# Patient Record
Sex: Female | Born: 1985 | Race: White | Hispanic: No | Marital: Single | Smoking: Never smoker
Health system: Southern US, Community
[De-identification: ages and names within clinical notes are randomized; demographics above are authoritative.]

## PROBLEM LIST (undated history)

## (undated) DIAGNOSIS — E042 Nontoxic multinodular goiter: Secondary | ICD-10-CM

## (undated) DIAGNOSIS — F329 Major depressive disorder, single episode, unspecified: Secondary | ICD-10-CM

## (undated) DIAGNOSIS — G47419 Narcolepsy without cataplexy: Secondary | ICD-10-CM

## (undated) DIAGNOSIS — F32A Depression, unspecified: Secondary | ICD-10-CM

## (undated) HISTORY — DX: Nontoxic multinodular goiter: E04.2

## (undated) HISTORY — PX: CHOLECYSTECTOMY: SHX55

## (undated) HISTORY — PX: EYE SURGERY: SHX253

## (undated) HISTORY — PX: OTHER SURGICAL HISTORY: SHX169

## (undated) HISTORY — DX: Depression, unspecified: F32.A

## (undated) HISTORY — DX: Major depressive disorder, single episode, unspecified: F32.9

---
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
## 2004-08-31 ENCOUNTER — Ambulatory Visit: Payer: Self-pay | Admitting: Cardiology

## 2005-02-14 ENCOUNTER — Emergency Department (HOSPITAL_COMMUNITY): Admission: EM | Admit: 2005-02-14 | Discharge: 2005-02-14 | Payer: Self-pay | Admitting: Emergency Medicine

## 2005-03-02 ENCOUNTER — Ambulatory Visit: Payer: Self-pay | Admitting: Internal Medicine

## 2008-07-09 ENCOUNTER — Encounter: Payer: Self-pay | Admitting: Pulmonary Disease

## 2008-07-09 ENCOUNTER — Ambulatory Visit: Payer: Self-pay | Admitting: Cardiology

## 2008-07-20 ENCOUNTER — Ambulatory Visit: Payer: Self-pay | Admitting: Cardiology

## 2008-07-29 ENCOUNTER — Encounter: Payer: Self-pay | Admitting: Pulmonary Disease

## 2008-08-26 ENCOUNTER — Ambulatory Visit: Payer: Self-pay | Admitting: Pulmonary Disease

## 2008-08-26 DIAGNOSIS — I10 Essential (primary) hypertension: Secondary | ICD-10-CM | POA: Insufficient documentation

## 2008-08-26 DIAGNOSIS — R0602 Shortness of breath: Secondary | ICD-10-CM | POA: Insufficient documentation

## 2008-08-26 DIAGNOSIS — B009 Herpesviral infection, unspecified: Secondary | ICD-10-CM | POA: Insufficient documentation

## 2008-08-26 DIAGNOSIS — Z8679 Personal history of other diseases of the circulatory system: Secondary | ICD-10-CM | POA: Insufficient documentation

## 2008-08-26 DIAGNOSIS — O99810 Abnormal glucose complicating pregnancy: Secondary | ICD-10-CM

## 2008-08-26 DIAGNOSIS — J309 Allergic rhinitis, unspecified: Secondary | ICD-10-CM | POA: Insufficient documentation

## 2008-09-21 ENCOUNTER — Ambulatory Visit: Payer: Self-pay | Admitting: Pulmonary Disease

## 2009-02-06 ENCOUNTER — Emergency Department (HOSPITAL_COMMUNITY): Admission: EM | Admit: 2009-02-06 | Discharge: 2009-02-06 | Payer: Self-pay | Admitting: Emergency Medicine

## 2009-02-07 ENCOUNTER — Observation Stay (HOSPITAL_COMMUNITY): Admission: RE | Admit: 2009-02-07 | Discharge: 2009-02-08 | Payer: Self-pay | Admitting: Emergency Medicine

## 2009-02-08 ENCOUNTER — Encounter (INDEPENDENT_AMBULATORY_CARE_PROVIDER_SITE_OTHER): Payer: Self-pay | Admitting: General Surgery

## 2009-02-13 ENCOUNTER — Emergency Department (HOSPITAL_COMMUNITY): Admission: EM | Admit: 2009-02-13 | Discharge: 2009-02-14 | Payer: Self-pay | Admitting: Emergency Medicine

## 2010-08-26 LAB — DIFFERENTIAL
Basophils Absolute: 0 10*3/uL (ref 0.0–0.1)
Basophils Absolute: 0 10*3/uL (ref 0.0–0.1)
Basophils Relative: 0 % (ref 0–1)
Basophils Relative: 1 % (ref 0–1)
Eosinophils Absolute: 0.3 10*3/uL (ref 0.0–0.7)
Eosinophils Absolute: 0.5 10*3/uL (ref 0.0–0.7)
Eosinophils Relative: 6 % — ABNORMAL HIGH (ref 0–5)
Lymphocytes Relative: 20 % (ref 12–46)
Lymphs Abs: 2.6 10*3/uL (ref 0.7–4.0)
Lymphs Abs: 2.7 10*3/uL (ref 0.7–4.0)
Monocytes Absolute: 0.5 10*3/uL (ref 0.1–1.0)
Monocytes Relative: 6 % (ref 3–12)
Monocytes Relative: 6 % (ref 3–12)
Neutro Abs: 5.6 10*3/uL (ref 1.7–7.7)
Neutro Abs: 4.3 10*3/uL (ref 1.7–7.7)
Neutrophils Relative %: 44 % (ref 43–77)
Neutrophils Relative %: 54 % (ref 43–77)

## 2010-08-26 LAB — URINALYSIS, ROUTINE W REFLEX MICROSCOPIC
Glucose, UA: NEGATIVE mg/dL
Hgb urine dipstick: NEGATIVE
Ketones, ur: NEGATIVE mg/dL
Ketones, ur: NEGATIVE mg/dL
Nitrite: NEGATIVE
Nitrite: NEGATIVE
Protein, ur: NEGATIVE mg/dL
Protein, ur: NEGATIVE mg/dL
Specific Gravity, Urine: >1.03 — ABNORMAL HIGH (ref 1.005–1.030)
Urobilinogen, UA: 0.2 mg/dL (ref 0.0–1.0)
Urobilinogen, UA: 0.2 mg/dL (ref 0.0–1.0)

## 2010-08-26 LAB — COMPREHENSIVE METABOLIC PANEL
ALT: 95 U/L — ABNORMAL HIGH (ref 0–35)
ALT: 28 U/L (ref 0–35)
Albumin: 3.8 g/dL (ref 3.5–5.2)
Alkaline Phosphatase: 97 U/L (ref 39–117)
Alkaline Phosphatase: 75 U/L (ref 39–117)
BUN: 7 mg/dL (ref 6–23)
BUN: 7 mg/dL (ref 6–23)
CO2: 27 mEq/L (ref 19–32)
CO2: 28 mEq/L (ref 19–32)
Calcium: 9.7 mg/dL (ref 8.4–10.5)
Calcium: 9.3 mg/dL (ref 8.4–10.5)
Chloride: 104 mEq/L (ref 96–112)
Creatinine, Ser: 0.94 mg/dL (ref 0.4–1.2)
GFR calc non Af Amer: >60 mL/min (ref >60)
GFR calc non Af Amer: >60 mL/min (ref >60)
Glucose, Bld: 89 mg/dL (ref 70–99)
Glucose, Bld: 92 mg/dL (ref 70–99)
Sodium: 138 mEq/L (ref 135–145)
Sodium: 137 mEq/L (ref 135–145)
Total Bilirubin: 0.8 mg/dL (ref 0.3–1.2)
Total Protein: 7.5 g/dL (ref 6.0–8.3)
Total Protein: 6.9 g/dL (ref 6.0–8.3)

## 2010-08-26 LAB — CBC
HCT: 36.9 % (ref 36.0–46.0)
HCT: 32.8 % — ABNORMAL LOW (ref 36.0–46.0)
Hemoglobin: 13.2 g/dL (ref 12.0–15.0)
Hemoglobin: 10.9 g/dL — ABNORMAL LOW (ref 12.0–15.0)
MCHC: 33.3 g/dL (ref 30.0–36.0)
MCHC: 33.7 g/dL (ref 30.0–36.0)
MCHC: 33.3 g/dL (ref 30.0–36.0)
MCV: 82.1 fL (ref 78.0–100.0)
Platelets: 277 10*3/uL (ref 150–400)
RBC: 4.77 MIL/uL (ref 3.87–5.11)
RBC: 3.98 MIL/uL (ref 3.87–5.11)
RDW: 17.5 % — ABNORMAL HIGH (ref 11.5–15.5)
RDW: 17.1 % — ABNORMAL HIGH (ref 11.5–15.5)
WBC: 6 10*3/uL (ref 4.0–10.5)

## 2010-08-26 LAB — D-DIMER, QUANTITATIVE: D-Dimer, Quant: 0.69 ug/mL-FEU — ABNORMAL HIGH (ref 0.00–0.48)

## 2010-08-26 LAB — LIPASE, BLOOD
Lipase: 22 U/L (ref 11–59)
Lipase: 19 U/L (ref 11–59)

## 2010-08-26 LAB — POCT PREGNANCY, URINE: Preg Test, Ur: NEGATIVE

## 2010-08-26 LAB — URINE MICROSCOPIC-ADD ON

## 2010-10-04 NOTE — Assessment & Plan Note (Signed)
[facility]                          [facility] OFFICE NOTE   NAME:Carla Shepard, Carla Shepard                        MRN:          [id number]  DATE:07/20/2008                            DOB:          [birth year]    REASON FOR CONSULTATION:  Dyspnea during pregnancy.   HISTORY OF PRESENT ILLNESS:  The patient is a 25 year old white female  with 24 weeks of gestation, first pregnancy with complains of  progressive dyspnea both at rest on an exertion.  The patient has  reportedly history of congenital murmur.  She also was diagnosed with  POTTS syndrome at age 49 and apparently she has seen one of the [facility]  doctors in [place].  This was also at that time that she was  battling some depression and she was placed both for POTTS and  depression on Prozac.  She is still currently taking this medication.  She has a history of mitral valve prolapse and HSV.  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  She also was taking atenolol prior to her pregnancy as part  of her treatment for POTTS syndrome.  The patient states that her  dyspnea is actually chronic.  She states that even before her pregnancy,  she would complain of dyspnea on exertion, but pregnancy seem to have  aggravated.  Also, over the last couple of months, she has had  difficulties sleeping, laying flat.  She states that a friend does  notice that she has rattling in her chest when she lays flat.  She  herself has also heard some audible wheezing.  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  She has been treated with 4 courses of  antibiotics for bronchitis during her pregnancy, and also has received  albuterol treatments via the hospital because of the wheezing.  The  patient does not have a history of asthma, however.   The patient also states that  she has no postnasal drip.  She does not  complain of any reflux symptoms, although clearly her symptoms of  wheezing appear to worsen, when she lays flat.  She denies, however, any  chest pain.  She also complains both at rest on an exertion of an  excessively fast heart rate, sometimes she monitors heart rate in the  120s-130s.  Prior to her pregnancy, she remembers on 1 occasion her  heart rate was 160 and she had to go to [facility] to receive  intravenous fluids.  The patient also thinks that her heart rate may be  irregular.  She denies dizziness, but no frank syncope.   ALLERGIES:  No known drug allergies.   SOCIAL HISTORY:  The patient does not smoke or drink.  She denies any  drug use.   FAMILY HISTORY:  Notable for mother has hypertension and father has  hypertension.  She has no siblings.   PAST MEDICAL HISTORY:  As  outlined above.  1. Hypertension.  2. History of HSV.  3. Mitral valve prolapse.  4. Questionable regurgitation.  5. Congenital murmur.  6. POP syndrome was diagnosed at age 47.  7. Bronchitis, diagnosed recently by the [facility].   PAST SURGICAL HISTORY:  Wisdom teeth removal.   MEDICATIONS:  1. Prozac 20 mg a day.  2. Flintstone vitamin daily.   REVIEW OF SYSTEMS:  No nausea or vomiting.  No fever or chills.  No  melena or hematochezia.  No dysuria or frequency.  The remainder review  of systems is negative.  Pertinent positives as outlined above.   PHYSICAL EXAMINATION:  VITAL SIGNS:  Blood pressure 122/80, heart rate  98, and weight is 199 pounds.  GENERAL:  Well-nourished white female short in stature.  HEENT:  Pupils isochoric.  Conjunctivae clear.  NECK:  Supple.  Normal carotid upstroke and no carotid bruits.  No  thyromegaly.  Non-nodular thyroid.  LUNGS:  Bilateral wheezing with prolonged experience throughout both  lung fields.  HEART:  Regular rate and rhythm with normal S1 and S2.  No murmur, rubs,  or gallops.  I  cannot hear pathological murmur nor a physiological  murmur.  ABDOMEN:  Distended consistent with 24 weeks of gestation.  EXTREMITIES:  No cyanosis, clubbing, or edema.  NEUROLOGIC:  The patient is alert and oriented.  Grossly nonfocal.  Dorsalis pedis and posterior tibial pulses are intact.   PROBLEM LIST:  1. Dyspnea.      a.     Rule out asthma/asthmatic bronchitis.      b.     Tachycardia related to POP syndrome.      c.     Rule out structural heart disease (negative echocardiogram       recently with normal left ventricular function)  2. Right bundle-branch block.  3. POP syndrome, on Prozac.   PLAN:  1. The patient main problem seems to be significant audible wheezing.      She states that she never had wheezing before pregnancy and is only      started in the last few months.  It appeared to worsen when she      lays flat.  This makes me concerned that she could have reflux      disease.  We will refer her to Pulmonary colleagues at [facility].  We      deferred to evaluation to them.  2. As far as the patient's cardiac condition, this appears to be      stable.  She does have a right bundle-branch block and will need to      obtain the old EKGs from Dr. Neita Carp.  In this setting, we will also      obtain lower extremity venous Dopplers to make sure that she does      not have any lower extremity deep vein thrombosis.  I think this is      unlikely based on the clinical exam and Geneva score pre-test      probability.  Her tachycardia does appear to be related to POP      syndrome.  3. We will apply a CardioNet monitor.  The patient has atrial      fibrillation, which is excessive.  She might require a beta-blocker      in her third trimester, which should be well tolerated.  However,      with the wheezing this may raise other problems and will have to  discuss further with her with the [facility] and      probably Obstetrics and Gynecology to see what is safe  for this      patient to use.  4. At this point, I have not placed the patient on any medications, as      I think that her dyspnea is predominately a part to deconditioning      in her pregnancy and known POP syndrome with excessive heart rate,      but also due to significant wheezing, possibly due to asthma.  The      patient will have a CardioNet monitor applied and will make further      decisions based on those results.     Learta Codding, MD,FACC     GED/MedQ  DD: 07/20/2008  DT: 07/21/2008  Job #: [id number]

## 2013-08-27 ENCOUNTER — Encounter (HOSPITAL_COMMUNITY): Payer: Self-pay | Admitting: Emergency Medicine

## 2013-08-27 DIAGNOSIS — Z8669 Personal history of other diseases of the nervous system and sense organs: Secondary | ICD-10-CM | POA: Insufficient documentation

## 2013-08-27 DIAGNOSIS — K089 Disorder of teeth and supporting structures, unspecified: Secondary | ICD-10-CM | POA: Insufficient documentation
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
## 2013-08-28 ENCOUNTER — Emergency Department (HOSPITAL_COMMUNITY)
Admission: EM | Admit: 2013-08-28 | Discharge: 2013-08-28 | Discharge status: Against Medical Advice | Payer: Medicaid Other | Attending: Emergency Medicine | Admitting: Emergency Medicine

## 2013-08-28 HISTORY — DX: Narcolepsy without cataplexy: G47.419

## 2013-08-28 NOTE — ED Notes (Signed)
Patient not in waiting room x 3 for room placement. 

## 2013-08-28 NOTE — ED Notes (Signed)
No answer for room placement x 3.

## 2013-08-28 NOTE — ED Notes (Signed)
No answer for room placement.

## 2013-08-28 NOTE — ED Notes (Signed)
No answer x 2 for room placement

## 2015-10-07 ENCOUNTER — Emergency Department (HOSPITAL_COMMUNITY): Payer: Medicaid Other

## 2015-10-07 ENCOUNTER — Emergency Department (HOSPITAL_COMMUNITY)
Admission: EM | Admit: 2015-10-07 | Discharge: 2015-10-07 | Disposition: A | Discharge status: Home / Self Care | Payer: Medicaid Other | Attending: Emergency Medicine | Admitting: Emergency Medicine

## 2015-10-07 ENCOUNTER — Encounter (HOSPITAL_COMMUNITY): Payer: Self-pay | Admitting: Cardiology

## 2015-10-07 DIAGNOSIS — Y939 Activity, unspecified: Secondary | ICD-10-CM | POA: Insufficient documentation

## 2015-10-07 DIAGNOSIS — Y999 Unspecified external cause status: Secondary | ICD-10-CM | POA: Insufficient documentation

## 2015-10-07 DIAGNOSIS — Y929 Unspecified place or not applicable: Secondary | ICD-10-CM | POA: Insufficient documentation

## 2015-10-07 DIAGNOSIS — G44319 Acute post-traumatic headache, not intractable: Secondary | ICD-10-CM | POA: Diagnosis not present

## 2015-10-07 DIAGNOSIS — R51 Headache: Secondary | ICD-10-CM | POA: Diagnosis present

## 2015-10-07 DIAGNOSIS — E041 Nontoxic single thyroid nodule: Secondary | ICD-10-CM | POA: Insufficient documentation

## 2015-10-07 DIAGNOSIS — Z79899 Other long term (current) drug therapy: Secondary | ICD-10-CM | POA: Diagnosis not present

## 2015-10-07 MED ORDER — HYDROMORPHONE HCL 1 MG/ML IJ SOLN
1.0000 mg | Freq: Once | INTRAMUSCULAR | Status: AC
  Administered 2015-10-07: 1 mg via INTRAMUSCULAR
  Filled 2015-10-07: qty 1

## 2015-10-07 MED ORDER — CYCLOBENZAPRINE HCL 10 MG PO TABS
10.0000 mg | ORAL_TABLET | Freq: Once | ORAL | Status: AC
  Administered 2015-10-07: 10 mg via ORAL
  Filled 2015-10-07: qty 1

## 2015-10-07 MED ORDER — ACETAMINOPHEN 325 MG PO TABS
650.0000 mg | ORAL_TABLET | Freq: Once | ORAL | Status: AC
  Administered 2015-10-07: 650 mg via ORAL
  Filled 2015-10-07: qty 2

## 2015-10-07 MED ORDER — CYCLOBENZAPRINE HCL 10 MG PO TABS: 10.0000 mg | ORAL_TABLET | Freq: Three times a day (TID) | ORAL | Status: DC | PRN
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
## 2015-10-07 NOTE — ED Notes (Signed)
Patient requesting something for pain. PA aware. No further orders.
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
## 2015-10-07 NOTE — ED Notes (Signed)
Patient with no complaints at this time. Respirations even and unlabored. Skin warm/dry. Discharge instructions reviewed with patient at this time. Patient given opportunity to voice concerns/ask questions. Patient discharged at this time and left Emergency Department with steady gait.   

## 2016-02-14 ENCOUNTER — Ambulatory Visit (INDEPENDENT_AMBULATORY_CARE_PROVIDER_SITE_OTHER): Payer: Medicaid Other | Admitting: Neurology

## 2016-02-14 ENCOUNTER — Encounter: Payer: Self-pay | Admitting: Neurology

## 2016-02-14 VITALS — BP 132/92 | HR 88 | Resp 20 | Ht 63.0 in | Wt 173.0 lb

## 2016-02-14 DIAGNOSIS — F339 Major depressive disorder, recurrent, unspecified: Secondary | ICD-10-CM

## 2016-02-14 DIAGNOSIS — G47419 Narcolepsy without cataplexy: Secondary | ICD-10-CM

## 2016-02-14 DIAGNOSIS — G4710 Hypersomnia, unspecified: Secondary | ICD-10-CM

## 2016-02-14 DIAGNOSIS — F5089 Other specified eating disorder: Secondary | ICD-10-CM | POA: Diagnosis not present

## 2016-02-14 MED ORDER — MODAFINIL 200 MG PO TABS: 200.0000 mg | ORAL_TABLET | Freq: Every day | ORAL | 3 refills | Status: AC
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
## 2016-02-14 NOTE — Addendum Note (Signed)
Addended by: Melvyn NovasHMEIER, Homar Weinkauf on: 02/14/2016 09:49 AM   Modules accepted: Orders

## 2016-02-14 NOTE — Patient Instructions (Signed)
Narcolepsy  Narcolepsy is a nervous system disorder that causes daytime sleepiness and sudden bouts of irresistible sleep during the day (sleep attacks). Many people with narcolepsy live with extreme daytime sleepiness for many years before being diagnosed and treated. Narcolepsy is a lifelong (chronic) disorder.   You normally go through cycles when you sleep. When your sleep becomes deeper, you have less body movement, and you start dreaming. This type of deep sleep should happen after about 90 minutes of lighter sleep. Deep sleep is called rapid eye movement (REM) sleep. When you have narcolepsy, REM is not well regulated. It can happen as soon as you fall asleep, and components of REM sleep can occur during the day when you are awake. Uncontrolled REM sleep causes symptoms of narcolepsy.  CAUSES  Narcolepsy may be caused by an abnormality with a chemical messenger (neurotransmitter) in your brain that controls your sleep and wake cycles. Most people with narcolepsy have low levels of the neurotransmitter hypocretin. Hypocretin is important for controlling wakefulness.   A hypocretin imbalance may be caused by abnormal genes that are passed down through families. It may also develop if the body's defense system (immune system) mistakenly attacks the brain cells that produce hypocretin (autoimmune disease).  RISK FACTORS  You may be at higher risk for narcolepsy if you have a family history of the disease. Other risk factors that may contribute include:  · Injuries, tumors, or infections in the areas of the brain that control sleep.  · Exposure to toxins.  · Stress.  · Hormones produced during puberty and menopause.  · Poor sleep habits.  SIGNS AND SYMPTOMS   Symptoms of narcolepsy can start at any age but usually begin when people are between the ages of 7 and 25 years. There are four major symptoms. Not everyone with narcolepsy will have all four.   · Excessive daytime sleepiness. This is the most common symptom  and is usually the first symptom you will notice.  ¨ You may feel as if you are in a mental fog.  ¨ Daytime sleepiness may severely affect your performance at work or school.  ¨ You may fall asleep during a conversation or while eating dinner.  · Sudden loss of muscle tone (cataplexy). You do not lose consciousness, but you may suddenly lose muscle control. When this occurs, your speech may become slurred, or your knees may buckle. This symptom is usually triggered by surprise, anger, or laughter.  · Sleep paralysis. You may lose the ability to speak or move just as you start to fall asleep or wake up. You will be aware of the paralysis. It usually lasts for just a few seconds or minutes.  · Vivid hallucinations. These may occur with sleep paralysis. The hallucinations are like having bizarre or frightening dreams while you are still awake.  Other symptoms may include:   · Trouble staying asleep at night (insomnia).  · Restless sleep.  · Feeling a strong urge to get up at night to smoke or eat.  DIAGNOSIS   Your health care provider can diagnose narcolepsy based on your symptoms and the results of two diagnostic tests. You may also be asked to keep a sleep diary for several weeks. You may need the tests if you have had daytime sleepiness for at least 3 months. The two tests are:   · A polysomnogram to find out how well your REM sleep is regulated at night. This test is an overnight sleep study. It measures your   heart rate, breathing, movement, and brain waves.  · A multiple sleep latency test (MSLT) to find out how well your REM sleep is regulated during the day. This is a daytime sleep study. You may need to take several naps during the day. This test also measures your heart rate, breathing, movement, and brain waves.  TREATMENT   There is no cure for narcolepsy, but treatment can be very effective in helping manage the condition. Treatment may include:  · Lifestyle and sleeping strategies to help cope with the  condition.  · Medicines. These may include:    Stimulant medicines to fight daytime sleepiness.    Antidepressant medicines to treat cataplexy.    Sodium oxybate. This is a strong sedative that you take at night. It can help daytime sleepiness and cataplexy.  HOME CARE INSTRUCTIONS  · Take all medicines as directed by your health care provider.  · Follow these sleep practices:  ¨ Try to get about 8 hours of sleep every night.  ¨ Go to sleep and get up close to the same time every day.  ¨ Keep your bedroom dark, quiet, and comfortable.  · Schedule short naps for when you feel sleepiest during the day. Tell your employer or teachers that you have narcolepsy. You may be able to adjust your schedule to include time for naps.  · Try to get at least 20 minutes of exercise every day. This will help you sleep better at night and reduce daytime sleepiness.  · Do not drink alcohol or caffeinated beverages within 4-5 hours of bedtime.  · Do not eat a heavy meal before bedtime. Eat at about the same times every day.  · Do not smoke.  · Do not drive if you are sleepy or have untreated narcolepsy.  · Do not swim or go out on the water without a life jacket.  SEEK MEDICAL CARE IF:  Your medicines are not controlling your narcolepsy symptoms.  SEEK IMMEDIATE MEDICAL CARE IF:  · You hurt yourself during a sleep attack or an attack of cataplexy.  · You have chest pain or trouble breathing.     This information is not intended to replace advice given to you by your health care provider. Make sure you discuss any questions you have with your health care provider.     Document Released: 04/28/2002 Document Revised: 05/29/2014 Document Reviewed: 04/30/2013  Elsevier Interactive Patient Education ©2016 [employer]

## 2016-02-22 ENCOUNTER — Telehealth: Payer: Self-pay | Admitting: Neurology

## 2016-02-22 NOTE — Telephone Encounter (Signed)
Pt called in asking for PA on modafinil (PROVIGIL) 200 MG tablet

## 2016-02-22 NOTE — Telephone Encounter (Signed)
I spoke to Dr. Vickey Hugerohmeier. Pt cannot use modafinil the week before her sleep studies. The pa should be done after the sleep studies so we may get the pa approved.  I called pt to discuss. No answer, left a message asking her to call me back.

## 2016-02-23 NOTE — Telephone Encounter (Signed)
I spoke to pt and advised her that we can't do the pa for modafinil until after her sleep studies. Pt cannot usethe modafinil for the week prior to her sleep studies, but can buy the medication out of pocket if she wishes to use it any other time. Pt verbalized understanding.

## 2016-03-06 ENCOUNTER — Ambulatory Visit (INDEPENDENT_AMBULATORY_CARE_PROVIDER_SITE_OTHER): Payer: Medicaid Other | Admitting: Neurology

## 2016-03-06 DIAGNOSIS — G47419 Narcolepsy without cataplexy: Secondary | ICD-10-CM

## 2016-03-06 DIAGNOSIS — G4710 Hypersomnia, unspecified: Secondary | ICD-10-CM

## 2016-03-07 ENCOUNTER — Encounter (INDEPENDENT_AMBULATORY_CARE_PROVIDER_SITE_OTHER): Payer: Medicaid Other | Admitting: Neurology

## 2016-03-07 ENCOUNTER — Other Ambulatory Visit (INDEPENDENT_AMBULATORY_CARE_PROVIDER_SITE_OTHER): Payer: Self-pay

## 2016-03-07 ENCOUNTER — Other Ambulatory Visit: Payer: Self-pay

## 2016-03-07 DIAGNOSIS — G47419 Narcolepsy without cataplexy: Secondary | ICD-10-CM | POA: Diagnosis not present

## 2016-03-07 DIAGNOSIS — G4710 Hypersomnia, unspecified: Secondary | ICD-10-CM

## 2016-03-07 DIAGNOSIS — Z5181 Encounter for therapeutic drug level monitoring: Secondary | ICD-10-CM

## 2016-03-07 DIAGNOSIS — Z0289 Encounter for other administrative examinations: Secondary | ICD-10-CM

## 2016-03-07 NOTE — Addendum Note (Signed)
Addended by: Geronimo RunningINKINS, Bronwyn Belasco A on: 03/07/2016 10:06 AM   Modules accepted: Orders

## 2016-03-07 NOTE — Progress Notes (Signed)
Per protocol for MSLT, urine drug screen order placed.

## 2016-03-07 NOTE — Addendum Note (Signed)
Addended by: Geronimo RunningINKINS, Amarii Amy A on: 03/07/2016 10:22 AM   Modules accepted: Orders

## 2016-03-12 LAB — DRUG SCREEN, UR (12+OXYCODONE+CRT)
AMPHETAMINE SCREEN URINE: POSITIVE — AB
BARBITURATE SCREEN URINE: NEGATIVE ng/mL
BENZODIAZEPINE SCREEN, URINE: NEGATIVE ng/mL
CANNABINOIDS UR QL SCN: NEGATIVE ng/mL
COCAINE(METAB.)SCREEN, URINE: NEGATIVE ng/mL
Creatinine(Crt), U: 116.2 mg/dL (ref 20.0–300.0)
FENTANYL, URINE: POSITIVE — AB
Meperidine Screen, Urine: NEGATIVE ng/mL
Methadone Screen, Urine: POSITIVE — AB
OXYCODONE+OXYMORPHONE UR QL SCN: NEGATIVE ng/mL
Opiate Scrn, Ur: NEGATIVE ng/mL
PHENCYCLIDINE QUANTITATIVE URINE: NEGATIVE ng/mL
PROPOXYPHENE SCREEN URINE: NEGATIVE ng/mL
Ph of Urine: 6.5 (ref 4.5–8.9)
SPECIFIC GRAVITY: 1.018
TRAMADOL SCREEN, URINE: NEGATIVE ng/mL

## 2016-03-16 ENCOUNTER — Telehealth: Payer: Self-pay | Admitting: Neurology

## 2016-03-16 NOTE — Telephone Encounter (Signed)
I left a voicemail of the patient's mobile phone explaining to her that I would like to discuss some test results with her and to call me back.

## 2016-03-21 NOTE — Telephone Encounter (Signed)
I called pt to discuss a good time for her to speak with Dr. Vickey Hugerohmeier. No answer, left a message asking pt to call me back.

## 2016-03-22 NOTE — Telephone Encounter (Signed)
I explained to the patient some discrepancies noted in her tox screen when compared to her list of prescribed medications. The patient stated that she will check if a recent gastroenterology procedure they have been performed under fentanyl use. I also mentioned that I found amphetamines, she was not supposed to be on any stimulants for 7 days prior to this test and I have not prescribed Adderall. The phone call was suddenly interrupted at this point. CD

## 2016-03-22 NOTE — Telephone Encounter (Signed)
I called pt. She was available to speak with Dr. Vickey Hugerohmeier at this time to discuss her MSLT results and her UDS. Dr. Vickey Hugerohmeier took the call.

## 2016-08-24 NOTE — H&P (Signed)
HISTORY AND PHYSICAL  Carla Shepard is a 31 y.o. female patient with [id number] teeh  No diagnosis found.  Past Medical History:  Diagnosis Date  . Depression   . Multiple thyroid nodules   . Narcolepsy     No current facility-administered medications for this encounter.    Current Outpatient Prescriptions  Medication Sig Dispense Refill  . amphetamine-dextroamphetamine (ADDERALL) 30 MG tablet Take 30 mg by mouth 2 (two) times daily.    Marland Kitchen buPROPion (WELLBUTRIN XL) 300 MG 24 hr tablet Take 300 mg by mouth daily.    Marland Kitchen doxycycline (VIBRA-TABS) 100 MG tablet Take 100 mg by mouth every 12 (twelve) hours. 10 day started 08-18-2016 for infection  0  . ibuprofen (ADVIL,MOTRIN) 200 MG tablet Take 600 mg by mouth 2 (two) times daily as needed for mild pain or moderate pain.    Marland Kitchen lamoTRIgine (LAMICTAL) 100 MG tablet Take 100 mg by mouth 2 (two) times daily.    Marland Kitchen loratadine (CLARITIN) 10 MG tablet Take 10 mg by mouth daily as needed for allergies.    . methadone (DOLOPHINE) 10 MG/ML solution Take 125 mg by mouth daily.    . modafinil (PROVIGIL) 200 MG tablet Take 1 tablet (200 mg total) by mouth daily. (Patient not taking: Reported on 08/22/2016) 30 tablet 3   Allergies  Allergen Reactions  . Sulfur Hives   Active Problems:   * No active hospital problems. *  Vitals: There were no vitals taken for this visit. Lab results:No results found for this or any previous visit (from the past 24 hour(s)). Radiology Results: No results found. General appearance: alert and cooperative Head: Normocephalic, without obvious abnormality, atraumatic Eyes: negative Nose: Nares normal. Septum midline. Mucosa normal. No drainage or sinus tenderness. Throat: multiple carious teeth. palatal torus, pharynx clear Neck: no adenopathy, supple, symmetrical, trachea midline and thyroid not enlarged, symmetric, no tenderness/mass/nodules Resp: clear to auscultation bilaterally Cardio: regular rate and rhythm, S1, S2  normal, no murmur, click, rub or gallop  Assessment: Nonrestorable teeth secondary to dental caries. Palatal torus.   Plan:Multiple dental extractions with alveoloplasy. Removal palatal torus.GA. day surgery   Dahlila Pfahler M 08/24/2016

## 2016-08-27 NOTE — Pre-Procedure Instructions (Signed)
Carla Shepard  08/27/2016      [facility] - [place], [place] - [address] Jerolyn Shin [place] [address] Phone: [phone] Fax: [phone]    Your procedure is scheduled on Fri, April 13 @ 10:00 AM  Report to [facility] Admitting at 8:00 AM  Call this number if you have problems the morning of surgery:  [phone]   Remember:  Do not eat food or drink liquids after midnight.  Take these medicines the morning of surgery with A SIP OF WATER Wellbutrin(Bupropion),Doxycycline(Vibra-Tabs),Lamictal(Lamotrigine),Claritin(Loratadine),and Pain Pill(if needed)              Stop taking your Ibuprofen. No Goody's,BC's,Aleve,Advil,Motrin,Fish Oil,or any Herbal Medications.    Do not wear jewelry, make-up or nail polish.  Do not wear lotions, powders,perfumes, or deoderant.  Do not shave 48 hours prior to surgery.    Do not bring valuables to the hospital.  [facility] is not responsible for any belongings or valuables.  Contacts, dentures or bridgework may not be worn into surgery.  Leave your suitcase in the car.  After surgery it may be brought to your room.  For patients admitted to the hospital, discharge time will be determined by your treatment team.  Patients discharged the day of surgery will not be allowed to drive home.    [facility]  Before surgery, you can play an important role.  Because skin is not sterile, your skin needs to be as free of germs as possible.  You can reduce the number of germs on you skin by washing with CHG (chlorahexidine gluconate) soap before surgery.  CHG is an antiseptic cleaner which kills germs and bonds with the skin to continue killing germs even after washing.  Please DO NOT use if you have an allergy to CHG or antibacterial soaps.  If your skin becomes reddened/irritated stop using the CHG and inform your nurse when you arrive at Short Stay.  Do not shave (including  legs and underarms) for at least 48 hours prior to the first CHG shower.  You may shave your face.  Please follow these instructions carefully:   1.  Shower with CHG Soap the night before surgery and the                                morning of Surgery.  2.  If you choose to wash your hair, wash your hair first as usual with your       normal shampoo.  3.  After you shampoo, rinse your hair and body thoroughly to remove the                      Shampoo.  4.  Use CHG as you would any other liquid soap.  You can apply chg directly       to the skin and wash gently with scrungie or a clean washcloth.  5.  Apply the CHG Soap to your body ONLY FROM THE NECK DOWN.        Do not use on open wounds or open sores.  Avoid contact with your eyes,       ears, mouth and genitals (private parts).  Wash genitals (private parts)       with your normal soap.  6.  Wash thoroughly, paying special attention to the  area where your surgery        will be performed.  7.  Thoroughly rinse your body with warm water from the neck down.  8.  DO NOT shower/wash with your normal soap after using and rinsing off       the CHG Soap.  9.  Pat yourself dry with a clean towel.            10.  Wear clean pajamas.            11.  Place clean sheets on your bed the night of your first shower and do not        sleep with pets.  Day of Surgery  Do not apply any lotions/deoderants the morning of surgery.  Please wear clean clothes to the hospital/surgery center.    Please read over the following fact sheets that you were given. Pain Booklet and Coughing and Deep Breathing

## 2016-08-28 ENCOUNTER — Encounter (HOSPITAL_COMMUNITY): Payer: Self-pay | Admitting: *Deleted

## 2016-08-28 ENCOUNTER — Encounter (HOSPITAL_COMMUNITY)
Admission: RE | Admit: 2016-08-28 | Discharge: 2016-08-28 | Disposition: A | Discharge status: Home / Self Care | Payer: Medicaid Other | Source: Ambulatory Visit | Attending: Oral Surgery | Admitting: Oral Surgery

## 2016-08-28 DIAGNOSIS — Z01812 Encounter for preprocedural laboratory examination: Secondary | ICD-10-CM | POA: Diagnosis not present

## 2016-08-28 DIAGNOSIS — Z79899 Other long term (current) drug therapy: Secondary | ICD-10-CM | POA: Diagnosis not present

## 2016-08-28 DIAGNOSIS — Z882 Allergy status to sulfonamides status: Secondary | ICD-10-CM | POA: Insufficient documentation

## 2016-08-28 DIAGNOSIS — K029 Dental caries, unspecified: Secondary | ICD-10-CM | POA: Insufficient documentation

## 2016-08-28 LAB — CBC
HCT: 35.2 % — ABNORMAL LOW (ref 36.0–46.0)
Hemoglobin: 11.3 g/dL — ABNORMAL LOW (ref 12.0–15.0)
MCH: 29.1 pg (ref 26.0–34.0)
MCHC: 32.1 g/dL (ref 30.0–36.0)
MCV: 90.7 fL (ref 78.0–100.0)
PLATELETS: 218 10*3/uL (ref 150–400)
RBC: 3.88 MIL/uL (ref 3.87–5.11)
RDW: 13.7 % (ref 11.5–15.5)
WBC: 6.5 10*3/uL (ref 4.0–10.5)

## 2016-08-28 LAB — HCG, SERUM, QUALITATIVE: PREG SERUM: NEGATIVE

## 2016-08-28 LAB — BASIC METABOLIC PANEL
Anion gap: 9 (ref 5–15)
BUN: 12 mg/dL (ref 6–20)
CALCIUM: 9.1 mg/dL (ref 8.9–10.3)
CO2: 28 mmol/L (ref 22–32)
CREATININE: 0.83 mg/dL (ref 0.44–1.00)
Chloride: 102 mmol/L (ref 101–111)
GFR calc non Af Amer: >60 mL/min (ref >60)
Glucose, Bld: 92 mg/dL (ref 65–99)
Potassium: 3.8 mmol/L (ref 3.5–5.1)
SODIUM: 139 mmol/L (ref 135–145)

## 2016-08-28 NOTE — Progress Notes (Signed)
PCP - [facility] Cardiologist - denies  Chest x-ray - not needed  EKG - not needed Stress Test - denies ECHO - denies Cardiac Cath - denies  Sleep Study - 2017 for Narcolepsy  CPAP - not needed    Patient denies shortness of breath, fever, cough and chest pain at PAT appointment   Patient verbalized understanding of instructions that was given to them at the PAT appointment. Patient expressed that there were no further questions.  Patient was also instructed that they will need to review over the PAT instructions again at home before the surgery.

## 2016-09-01 ENCOUNTER — Encounter (HOSPITAL_COMMUNITY): Payer: Self-pay | Admitting: Anesthesiology

## 2016-09-01 ENCOUNTER — Ambulatory Visit (HOSPITAL_COMMUNITY): Payer: Medicaid Other | Admitting: Certified Registered Nurse Anesthetist

## 2016-09-01 ENCOUNTER — Encounter (HOSPITAL_COMMUNITY)
Admission: RE | Disposition: A | Discharge status: Home / Self Care | Payer: Self-pay | Source: Ambulatory Visit | Attending: Oral Surgery

## 2016-09-01 ENCOUNTER — Other Ambulatory Visit (HOSPITAL_COMMUNITY): Payer: Self-pay | Admitting: *Deleted

## 2016-09-01 ENCOUNTER — Ambulatory Visit (HOSPITAL_COMMUNITY): Payer: Medicaid Other | Admitting: Vascular Surgery

## 2016-09-01 ENCOUNTER — Ambulatory Visit (HOSPITAL_COMMUNITY)
Admission: RE | Admit: 2016-09-01 | Discharge: 2016-09-01 | Disposition: A | Discharge status: Home / Self Care | Payer: Medicaid Other | Source: Ambulatory Visit | Attending: Oral Surgery | Admitting: Oral Surgery

## 2016-09-01 DIAGNOSIS — F329 Major depressive disorder, single episode, unspecified: Secondary | ICD-10-CM | POA: Insufficient documentation

## 2016-09-01 DIAGNOSIS — I10 Essential (primary) hypertension: Secondary | ICD-10-CM | POA: Diagnosis not present

## 2016-09-01 DIAGNOSIS — Z79899 Other long term (current) drug therapy: Secondary | ICD-10-CM | POA: Insufficient documentation

## 2016-09-01 DIAGNOSIS — M278 Other specified diseases of jaws: Secondary | ICD-10-CM | POA: Insufficient documentation

## 2016-09-01 DIAGNOSIS — G47419 Narcolepsy without cataplexy: Secondary | ICD-10-CM | POA: Diagnosis not present

## 2016-09-01 DIAGNOSIS — E669 Obesity, unspecified: Secondary | ICD-10-CM | POA: Diagnosis not present

## 2016-09-01 DIAGNOSIS — K048 Radicular cyst: Secondary | ICD-10-CM | POA: Diagnosis not present

## 2016-09-01 DIAGNOSIS — Z6832 Body mass index (BMI) 32.0-32.9, adult: Secondary | ICD-10-CM | POA: Insufficient documentation

## 2016-09-01 DIAGNOSIS — K029 Dental caries, unspecified: Secondary | ICD-10-CM | POA: Insufficient documentation

## 2016-09-01 HISTORY — PX: MULTIPLE EXTRACTIONS WITH ALVEOLOPLASTY: SHX5342

## 2016-09-01 SURGERY — MULTIPLE EXTRACTION WITH ALVEOLOPLASTY
Anesthesia: General | Site: Mouth | Laterality: Bilateral

## 2016-09-01 MED ORDER — PROPOFOL 10 MG/ML IV BOLUS
INTRAVENOUS | Status: DC | PRN
  Administered 2016-09-01: 160 mg via INTRAVENOUS

## 2016-09-01 MED ORDER — SUGAMMADEX SODIUM 200 MG/2ML IV SOLN
INTRAVENOUS | Status: DC | PRN
  Administered 2016-09-01: 200 mg via INTRAVENOUS

## 2016-09-01 MED ORDER — HYDROMORPHONE HCL 1 MG/ML IJ SOLN
INTRAMUSCULAR | Status: AC
  Filled 2016-09-01: qty 0.5

## 2016-09-01 MED ORDER — LACTATED RINGERS IV SOLN
INTRAVENOUS | Status: DC
  Administered 2016-09-01: 09:00:00 via INTRAVENOUS

## 2016-09-01 MED ORDER — HYDROMORPHONE HCL 1 MG/ML IJ SOLN
INTRAMUSCULAR | Status: AC
  Administered 2016-09-01: 0.5 mg via INTRAVENOUS
  Filled 2016-09-01: qty 1

## 2016-09-01 MED ORDER — ROCURONIUM BROMIDE 100 MG/10ML IV SOLN
INTRAVENOUS | Status: DC | PRN
  Administered 2016-09-01: 40 mg via INTRAVENOUS

## 2016-09-01 MED ORDER — HYDROCODONE-ACETAMINOPHEN 7.5-325 MG PO TABS: 1.0000 | ORAL_TABLET | Freq: Once | ORAL | Status: DC | PRN

## 2016-09-01 MED ORDER — MIDAZOLAM HCL 2 MG/2ML IJ SOLN
INTRAMUSCULAR | Status: AC
  Filled 2016-09-01: qty 2

## 2016-09-01 MED ORDER — DEXAMETHASONE SODIUM PHOSPHATE 10 MG/ML IJ SOLN
INTRAMUSCULAR | Status: DC | PRN
  Administered 2016-09-01: 10 mg via INTRAVENOUS

## 2016-09-01 MED ORDER — OXYMETAZOLINE HCL 0.05 % NA SOLN
NASAL | Status: DC | PRN
  Administered 2016-09-01: 2 via NASAL

## 2016-09-01 MED ORDER — PROPOFOL 10 MG/ML IV BOLUS
INTRAVENOUS | Status: AC
  Filled 2016-09-01: qty 20

## 2016-09-01 MED ORDER — LIDOCAINE 2% (20 MG/ML) 5 ML SYRINGE
INTRAMUSCULAR | Status: AC
  Filled 2016-09-01: qty 15

## 2016-09-01 MED ORDER — MIDAZOLAM HCL 5 MG/5ML IJ SOLN
INTRAMUSCULAR | Status: DC | PRN
  Administered 2016-09-01: 2 mg via INTRAVENOUS

## 2016-09-01 MED ORDER — ONDANSETRON HCL 4 MG/2ML IJ SOLN
INTRAMUSCULAR | Status: DC | PRN
  Administered 2016-09-01: 4 mg via INTRAVENOUS

## 2016-09-01 MED ORDER — METOCLOPRAMIDE HCL 5 MG/ML IJ SOLN: 10.0000 mg | Freq: Once | INTRAMUSCULAR | Status: DC | PRN

## 2016-09-01 MED ORDER — AMOXICILLIN 500 MG PO CAPS: 500.0000 mg | ORAL_CAPSULE | Freq: Three times a day (TID) | ORAL | 0 refills | Status: AC

## 2016-09-01 MED ORDER — MEPERIDINE HCL 25 MG/ML IJ SOLN: 6.2500 mg | INTRAMUSCULAR | Status: DC | PRN

## 2016-09-01 MED ORDER — FENTANYL CITRATE (PF) 250 MCG/5ML IJ SOLN
INTRAMUSCULAR | Status: AC
  Filled 2016-09-01: qty 5

## 2016-09-01 MED ORDER — SUGAMMADEX SODIUM 200 MG/2ML IV SOLN
INTRAVENOUS | Status: AC
  Filled 2016-09-01: qty 6

## 2016-09-01 MED ORDER — ROCURONIUM BROMIDE 50 MG/5ML IV SOSY
PREFILLED_SYRINGE | INTRAVENOUS | Status: AC
  Filled 2016-09-01: qty 15

## 2016-09-01 MED ORDER — LIDOCAINE-EPINEPHRINE 2 %-1:100000 IJ SOLN
INTRAMUSCULAR | Status: DC | PRN
  Administered 2016-09-01: 17 mL via INTRADERMAL

## 2016-09-01 MED ORDER — CEFAZOLIN SODIUM 1 G IJ SOLR
INTRAMUSCULAR | Status: DC | PRN
  Administered 2016-09-01: 2 g via INTRAMUSCULAR

## 2016-09-01 MED ORDER — ONDANSETRON HCL 4 MG/2ML IJ SOLN
INTRAMUSCULAR | Status: AC
  Filled 2016-09-01: qty 6

## 2016-09-01 MED ORDER — HYDROMORPHONE HCL 1 MG/ML IJ SOLN
0.2500 mg | INTRAMUSCULAR | Status: DC | PRN
  Administered 2016-09-01 (×4): 0.5 mg via INTRAVENOUS

## 2016-09-01 MED ORDER — SODIUM CHLORIDE 0.9 % IR SOLN
Status: DC | PRN
  Administered 2016-09-01: 1000 mL

## 2016-09-01 MED ORDER — FENTANYL CITRATE (PF) 100 MCG/2ML IJ SOLN
INTRAMUSCULAR | Status: DC | PRN
  Administered 2016-09-01: 100 ug via INTRAVENOUS

## 2016-09-01 MED ORDER — DEXAMETHASONE SODIUM PHOSPHATE 10 MG/ML IJ SOLN
INTRAMUSCULAR | Status: AC
  Filled 2016-09-01: qty 2

## 2016-09-01 MED ORDER — LIDOCAINE HCL (CARDIAC) 20 MG/ML IV SOLN
INTRAVENOUS | Status: DC | PRN
  Administered 2016-09-01: 70 mg via INTRAVENOUS

## 2016-09-01 MED ORDER — 0.9 % SODIUM CHLORIDE (POUR BTL) OPTIME
TOPICAL | Status: DC | PRN
  Administered 2016-09-01: 1000 mL

## 2016-09-01 MED ORDER — OXYMETAZOLINE HCL 0.05 % NA SOLN
NASAL | Status: AC
  Filled 2016-09-01: qty 15

## 2016-09-01 MED ORDER — OXYCODONE-ACETAMINOPHEN 5-325 MG PO TABS
1.0000 | ORAL_TABLET | ORAL | 0 refills | Status: AC | PRN
Start: 2016-09-01 — End: ?

## 2016-09-01 SURGICAL SUPPLY — 31 items
BUR CROSS CUT FISSURE 1.6 (BURR) ×2 IMPLANT
BUR CROSS CUT FISSURE 1.6MM (BURR) ×1
BUR EGG ELITE 4.0 (BURR) ×2 IMPLANT
BUR EGG ELITE 4.0MM (BURR) ×1
CANISTER SUCT 3000ML PPV (MISCELLANEOUS) ×3 IMPLANT
COVER SURGICAL LIGHT HANDLE (MISCELLANEOUS) ×3 IMPLANT
CRADLE DONUT ADULT HEAD (MISCELLANEOUS) ×3 IMPLANT
DECANTER SPIKE VIAL GLASS SM (MISCELLANEOUS) IMPLANT
DRAPE U-SHAPE 76X120 STRL (DRAPES) ×3 IMPLANT
FLUID NSS /IRRIG 1000 ML XXX (MISCELLANEOUS) ×3 IMPLANT
GAUZE PACKING FOLDED 2  STR (GAUZE/BANDAGES/DRESSINGS) ×2
GAUZE PACKING FOLDED 2 STR (GAUZE/BANDAGES/DRESSINGS) ×1 IMPLANT
GLOVE BIO SURGEON STRL SZ 6.5 (GLOVE) ×2 IMPLANT
GLOVE BIO SURGEON STRL SZ7.5 (GLOVE) ×3 IMPLANT
GLOVE BIO SURGEONS STRL SZ 6.5 (GLOVE) ×1
GLOVE BIOGEL PI IND STRL 7.0 (GLOVE) IMPLANT
GLOVE BIOGEL PI INDICATOR 7.0 (GLOVE)
GOWN STRL REUS W/ TWL LRG LVL3 (GOWN DISPOSABLE) ×1 IMPLANT
GOWN STRL REUS W/ TWL XL LVL3 (GOWN DISPOSABLE) ×1 IMPLANT
GOWN STRL REUS W/TWL LRG LVL3 (GOWN DISPOSABLE) ×3
GOWN STRL REUS W/TWL XL LVL3 (GOWN DISPOSABLE) ×3
KIT BASIN OR (CUSTOM PROCEDURE TRAY) ×3 IMPLANT
KIT ROOM TURNOVER OR (KITS) ×3 IMPLANT
NEEDLE 22X1 1/2 (OR ONLY) (NEEDLE) ×6 IMPLANT
NS IRRIG 1000ML POUR BTL (IV SOLUTION) ×3 IMPLANT
PAD ARMBOARD 7.5X6 YLW CONV (MISCELLANEOUS) ×3 IMPLANT
SUT CHROMIC 3 0 PS 2 (SUTURE) ×3 IMPLANT
SYR CONTROL 10ML LL (SYRINGE) ×3 IMPLANT
TRAY ENT MC OR (CUSTOM PROCEDURE TRAY) ×3 IMPLANT
TUBING IRRIGATION (MISCELLANEOUS) ×3 IMPLANT
YANKAUER SUCT BULB TIP NO VENT (SUCTIONS) ×3 IMPLANT

## 2016-09-01 NOTE — Transfer of Care (Signed)
Immediate Anesthesia Transfer of Care Note  Patient: Carla Shepard  Procedure(s) Performed: Procedure(s): EXTRACTION TEETH NUMBERS TWO, THREE, FOUR, FIVE, SIX, SEVEN, EIGHT, NINE, TEN, ELEVEN, TWELVE, THIRTEEN, FOURTEEN, FIFTEEN, EIGHTEEN, NINETEEN, TWENTY, TWENTY EIGHT, TWENTY NINE, THIRTY, THIRTY ONE WITH ALVEOLOPLASTY AND REMOVAL BILATERAL TORI (Bilateral)  Patient Location: PACU  Anesthesia Type:General  Level of Consciousness: awake, alert , oriented and patient cooperative  Airway & Oxygen Therapy: Patient Spontanous Breathing and Patient connected to nasal cannula oxygen  Post-op Assessment: Report given to RN and Post -op Vital signs reviewed and stable  Post vital signs: Reviewed and stable  Last Vitals:  Vitals:   09/01/16 0825  BP: 132/84  Pulse: 72  Resp: 18  Temp: 37.1 C    Last Pain:  Vitals:   09/01/16 0825  TempSrc: Oral      Patients Stated Pain Goal: 2 (09/01/16 0837)  Complications: No apparent anesthesia complications

## 2016-09-01 NOTE — Op Note (Signed)
NAME:  Carla Shepard, HUNT                      ACCOUNT NO.:  MEDICAL RECORD NO.:  [id number]  LOCATION:                                 FACILITY:  PHYSICIAN:  Georgia Lopes, M.D.       DATE OF BIRTH:  DATE OF PROCEDURE:  09/01/2016 DATE OF DISCHARGE:                              OPERATIVE REPORT   PREOPERATIVE DIAGNOSIS:  Nonrestorable teeth numbers 2, 3, 4, 5, 6, 7, 8, 9, 10, 11, 12, 13, 14, 15, 19, 20, 28, 29, 30, 31.  POSTOPERATIVE DIAGNOSES:  Nonrestorable teeth numbers 2, 3, 4, 5, 6, 7, 8, 9, 10, 11, 12, 13, 14, 15, 19, 20, 28, 29, 30, 31 and periapical cysts, teeth numbers 6, 9, 10, 4, and 31.  PROCEDURE:  Extraction of teeth numbers 2, 3, 4, 5, 6, 7, 8, 9, 10, 11, 12, 13, 14, 15, 19, 20, 28, 29, 30, 31, and alveoplasty right and left maxilla and right mandible, insertion of immediate denture and mandibular partial denture, and removal of multiple periapical cyst teeth numbers 4, 6, 9, 10, and 31.  SURGEON:  Georgia Lopes, M.D.  ANESTHESIA:  General nasal intubation, Dr. Malen Gauze attending.  DESCRIPTION OF PROCEDURE:  The patient was taken to the operating room, placed on the table in supine position.  General anesthesia administered intravenously, and a nasal endotracheal tube was placed and secured. The eyes were protected, and the patient was draped for the procedure. Time-out was performed.  The posterior pharynx was suctioned.  A throat pack was placed after draping the patient.  Then, 2% lidocaine with 1:100,000 epinephrine was infiltrated in an inferior alveolar block on the right and left sides, and buccal and palatal infiltration in the maxilla.  A total of 18 mL was utilized and a bite block was placed in the right side of the mouth.  The left side was operated first.  A #15 blade was used to make an incision around teeth numbers 19 and 20 in the mandible and around teeth numbers 15, 14, 13, 12, 11, 10, 9, 8, 7, 6, 5 in the maxilla on both the buccal and lingual  aspects in the gingival sulcus.  The periosteum was reflected and then, the teeth were elevated. Bone was removed from around teeth numbers 19 and 20 using a Stryker handpiece.  Bone was also removed around teeth numbers 13, 12, and 11. Then, the teeth were elevated with a 301 elevator and removed from the mouth with a dental forceps.  The sockets were curetted and irrigated. Then, the periosteum was reflected in the maxilla to expose the alveolar crest.  Upon doing so, there were noted to be several cystic lesions below the gingiva in the periapical region of the bone and teeth numbers 6, 9, 10, and as well as number 4 which was noted in the proximal right side of the incision.  The bone was removed overlying these cysts, and the cysts were enucleated and disposed off.  Then, the Stryker handpiece with the egg-shaped bur was used to perform alveoplasty in the left maxilla and then, the bone file was used to smooth the bone.  Then, the left maxilla and mandible were irrigated, closed with 3-0 chromic.  The bite block and sweetheart were repositioned to the other side of the mouth.  A 15 blade was used to make incision around teeth numbers 2, 3, and 4 in the maxilla and 28, 29, 30, 31 in the mandible.  Bone was removed around teeth numbers 4, 2, and 3 to facilitate removal as well as tooth number 6 and then, the 301 elevator was used to elevate the upper teeth and teeth numbers 2, 3, 4 were removed with the Universal upper forceps.  Then, in the mandible, bone was removed around teeth numbers 28, 29, 30, and 31 prior to removal of teeth.  Then, the 301 elevator and forceps were used to remove the teeth.  The sockets were curetted.  The cystic lesion was removed from tooth number 31, and then, the areas were reflected with the periosteal elevator to expose the crestal bone.  An alveoplasty was performed using the egg-shaped bur and bone file.  Then, the areas were irrigated and closed with  3-0 chromic. Then, the previously fabricated upper denture and lower partial denture were placed in the mouth and found to have good fit.  Then, the oral cavity was irrigated, suctioned, and throat pack was removed, and the patient was left in the care of Anesthesia for awakening, extubation, and transportation to Recovery.  ESTIMATED BLOOD LOSS:  Minimal.  COMPLICATIONS:  None.  SPECIMENS:  None.     Georgia Lopes, M.D.     SMJ/MEDQ  D:  09/01/2016  T:  09/01/2016  Job:  [id number]

## 2016-09-01 NOTE — Anesthesia Postprocedure Evaluation (Signed)
Anesthesia Post Note  Patient: Carla Shepard  Procedure(s) Performed: Procedure(s) (LRB): EXTRACTION TEETH NUMBERS TWO, THREE, FOUR, FIVE, SIX, SEVEN, EIGHT, NINE, TEN, ELEVEN, TWELVE, THIRTEEN, FOURTEEN, FIFTEEN, EIGHTEEN, NINETEEN, TWENTY, TWENTY EIGHT, TWENTY NINE, THIRTY, THIRTY ONE WITH ALVEOLOPLASTY AND REMOVAL BILATERAL TORI (Bilateral)  Patient location during evaluation: PACU Anesthesia Type: General Level of consciousness: awake and alert Pain management: pain level controlled Vital Signs Assessment: post-procedure vital signs reviewed and stable Respiratory status: spontaneous breathing, nonlabored ventilation and respiratory function stable Cardiovascular status: blood pressure returned to baseline and stable Postop Assessment: no signs of nausea or vomiting Anesthetic complications: no       Last Vitals:  Vitals:   09/01/16 1135 09/01/16 1145  BP:  129/69  Pulse: 90 83  Resp: 20 (!) 21  Temp:      Last Pain:  Vitals:   09/01/16 0825  TempSrc: Oral                 Yania Bogie A.

## 2016-09-01 NOTE — Anesthesia Preprocedure Evaluation (Signed)
Anesthesia Evaluation  Patient identified by MRN, date of birth, ID band Patient awake    Reviewed: Allergy & Precautions, NPO status , Patient's Chart, lab work & pertinent test results  Airway Mallampati: II  TM Distance: >3 FB Neck ROM: Full    Dental  (+) Poor Dentition   Pulmonary shortness of breath and with exertion,    Pulmonary exam normal breath sounds clear to auscultation       Cardiovascular hypertension, Normal cardiovascular exam Rhythm:Regular Rate:Normal     Neuro/Psych PSYCHIATRIC DISORDERS Depression Narcolepsy    GI/Hepatic (+)     substance abuse  , Hx/o opioid addiction Multiple dental caries with non restorable teeth      Endo/Other  Obesity Multiple thyroid nodules  Renal/GU negative Renal ROS  negative genitourinary   Musculoskeletal negative musculoskeletal ROS (+)   Abdominal (+) + obese,   Peds  Hematology negative hematology ROS (+)   Anesthesia Other Findings   Reproductive/Obstetrics                             Anesthesia Physical Anesthesia Plan  ASA: II  Anesthesia Plan: General   Post-op Pain Management:    Induction: Intravenous  Airway Management Planned: Nasal ETT  Additional Equipment:   Intra-op Plan:   Post-operative Plan: Extubation in OR  Informed Consent: I have reviewed the patients History and Physical, chart, labs and discussed the procedure including the risks, benefits and alternatives for the proposed anesthesia with the patient or authorized representative who has indicated his/her understanding and acceptance.   Dental advisory given  Plan Discussed with: Surgeon, Anesthesiologist and CRNA  Anesthesia Plan Comments:         Anesthesia Quick Evaluation

## 2016-09-01 NOTE — H&P (Signed)
H&P documentation  -History and Physical Reviewed  -Patient has been re-examined  -No change in the plan of care  Carla Shepard  

## 2016-09-01 NOTE — Op Note (Signed)
09/01/2016  10:44 AM  PATIENT:  Carla Shepard  31 y.o. female  PRE-OPERATIVE DIAGNOSIS:  NON RESTORABLE TEETH  TWO, THREE, FOUR, FIVE, SIX, SEVEN, EIGHT, NINE, TEN, ELEVEN, TWELVE, THIRTEEN, FOURTEEN, FIFTEEN, NINETEEN, TWENTY, TWENTY EIGHT, TWENTY NINE, THIRTY, THIRTY ONE    POST-OPERATIVE DIAGNOSIS:  SAME+ PERIAPICAL CYSTS TEETH #4, 6, 9, 10, 31  PROCEDURE:  Procedure(s): EXTRACTION TEETH NUMBERS TWO, THREE, FOUR, FIVE, SIX, SEVEN, EIGHT, NINE, TEN, ELEVEN, TWELVE, THIRTEEN, FOURTEEN, FIFTEEN,  NINETEEN, TWENTY, TWENTY EIGHT, TWENTY NINE, THIRTY, THIRTY ONE WITH ALVEOLOPLASTY AND REMOVAL PERIAPICAL CYSTS TEETH #4, 6, 9, 10, 31;  INSERTION MAXILLARY DENTURE AND MANDIBULAR PARTIAL DENTURe    SURGEON:  Surgeon(s): Ocie Doyne, DDS  ANESTHESIA:   local and general  EBL:  minimal  DRAINS: none   SPECIMEN:  No Specimen  COUNTS:  YES  PLAN OF CARE: Discharge to home after PACU  PATIENT DISPOSITION:  PACU - hemodynamically stable.   PROCEDURE DETAILS: Dictation #[id number]  Carla Shepard, DMD 09/01/2016 10:44 AM

## 2016-09-01 NOTE — Anesthesia Procedure Notes (Signed)
Procedure Name: Intubation Date/Time: 09/01/2016 10:08 AM Performed by: Shirlyn Goltz Pre-anesthesia Checklist: Patient identified, Emergency Drugs available, Suction available and Patient being monitored Patient Re-evaluated:Patient Re-evaluated prior to inductionOxygen Delivery Method: Circle system utilized Preoxygenation: Pre-oxygenation with 100% oxygen Intubation Type: IV induction Laryngoscope Size: Mac and 3 Grade View: Grade I Nasal Tubes: Right, Nasal prep performed and Nasal Rae Tube size: 6.5 mm Number of attempts: 1 Placement Confirmation: ETT inserted through vocal cords under direct vision,  positive ETCO2 and breath sounds checked- equal and bilateral Tube secured with: Tape Dental Injury: Teeth and Oropharynx as per pre-operative assessment

## 2016-09-02 ENCOUNTER — Encounter (HOSPITAL_COMMUNITY): Payer: Self-pay | Admitting: Oral Surgery

## 2016-12-04 ENCOUNTER — Emergency Department (HOSPITAL_COMMUNITY)
Admission: EM | Admit: 2016-12-04 | Discharge: 2016-12-04 | Disposition: A | Discharge status: Home / Self Care | Payer: Medicaid Other | Attending: Emergency Medicine | Admitting: Emergency Medicine

## 2016-12-04 ENCOUNTER — Encounter (HOSPITAL_COMMUNITY): Payer: Self-pay

## 2016-12-04 DIAGNOSIS — N764 Abscess of vulva: Secondary | ICD-10-CM | POA: Insufficient documentation

## 2016-12-04 DIAGNOSIS — R509 Fever, unspecified: Secondary | ICD-10-CM | POA: Diagnosis not present

## 2016-12-04 DIAGNOSIS — Z5321 Procedure and treatment not carried out due to patient leaving prior to being seen by health care provider: Secondary | ICD-10-CM | POA: Diagnosis not present

## 2016-12-04 NOTE — ED Notes (Signed)
Pt told registration she is leaving.

## 2016-12-04 NOTE — ED Triage Notes (Signed)
Abscess to left labia with fever x3 days.
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
## 2019-01-31 ENCOUNTER — Other Ambulatory Visit: Payer: Self-pay | Admitting: *Deleted

## 2019-01-31 DIAGNOSIS — Z20822 Contact with and (suspected) exposure to covid-19: Secondary | ICD-10-CM

## 2019-02-01 LAB — NOVEL CORONAVIRUS, NAA: SARS-CoV-2, NAA: NOT DETECTED
